# Patient Record
Sex: Male | Born: 1965 | Race: White | Hispanic: No | Marital: Married | Smoking: Current every day smoker
Health system: Southern US, Community
[De-identification: ages and names within clinical notes are randomized; demographics above are authoritative.]

---

## 2011-05-03 ENCOUNTER — Emergency Department: Payer: Self-pay | Admitting: Emergency Medicine

## 2013-10-26 ENCOUNTER — Emergency Department: Payer: Self-pay | Admitting: Emergency Medicine

## 2017-06-11 ENCOUNTER — Encounter: Payer: Self-pay | Admitting: Emergency Medicine

## 2017-06-11 ENCOUNTER — Emergency Department: Payer: Worker's Compensation

## 2017-06-11 ENCOUNTER — Emergency Department
Admission: EM | Admit: 2017-06-11 | Discharge: 2017-06-11 | Disposition: A | Discharge status: Home / Self Care | Payer: Worker's Compensation | Attending: Emergency Medicine | Admitting: Emergency Medicine

## 2017-06-11 DIAGNOSIS — Y999 Unspecified external cause status: Secondary | ICD-10-CM | POA: Diagnosis not present

## 2017-06-11 DIAGNOSIS — W231XXA Caught, crushed, jammed, or pinched between stationary objects, initial encounter: Secondary | ICD-10-CM | POA: Insufficient documentation

## 2017-06-11 DIAGNOSIS — S4991XA Unspecified injury of right shoulder and upper arm, initial encounter: Secondary | ICD-10-CM | POA: Diagnosis present

## 2017-06-11 DIAGNOSIS — M62838 Other muscle spasm: Secondary | ICD-10-CM | POA: Diagnosis not present

## 2017-06-11 DIAGNOSIS — Y929 Unspecified place or not applicable: Secondary | ICD-10-CM | POA: Insufficient documentation

## 2017-06-11 DIAGNOSIS — S471XXA Crushing injury of right shoulder and upper arm, initial encounter: Secondary | ICD-10-CM | POA: Diagnosis not present

## 2017-06-11 DIAGNOSIS — Y939 Activity, unspecified: Secondary | ICD-10-CM | POA: Diagnosis not present

## 2017-06-11 DIAGNOSIS — F17210 Nicotine dependence, cigarettes, uncomplicated: Secondary | ICD-10-CM | POA: Insufficient documentation

## 2017-06-11 DIAGNOSIS — R202 Paresthesia of skin: Secondary | ICD-10-CM

## 2017-06-11 MED ORDER — IBUPROFEN 800 MG PO TABS: 800.0000 mg | ORAL_TABLET | Freq: Three times a day (TID) | ORAL | 0 refills | Status: AC | PRN

## 2017-06-11 MED ORDER — ACETAMINOPHEN 500 MG PO TABS
1000.0000 mg | ORAL_TABLET | Freq: Once | ORAL | Status: AC
  Administered 2017-06-11: 1000 mg via ORAL
  Filled 2017-06-11: qty 2

## 2017-06-11 MED ORDER — OXYCODONE-ACETAMINOPHEN 5-325 MG PO TABS
2.0000 | ORAL_TABLET | Freq: Once | ORAL | Status: DC
  Filled 2017-06-11: qty 2

## 2017-06-11 MED ORDER — DIAZEPAM 5 MG PO TABS: 5.0000 mg | ORAL_TABLET | Freq: Three times a day (TID) | ORAL | 0 refills | Status: AC | PRN

## 2017-06-11 MED ORDER — DIAZEPAM 5 MG PO TABS
5.0000 mg | ORAL_TABLET | Freq: Once | ORAL | Status: AC
  Administered 2017-06-11: 5 mg via ORAL
  Filled 2017-06-11: qty 1

## 2017-06-11 NOTE — ED Triage Notes (Addendum)
,  Pt says this am, while working, he had a 17K lb trailer fall onto his right bicep; a co-worker had jacked the trailer up and as they were trying to fix something the trailer dropped off the jack and landed on his right upper arm;  tenderness on palpation to right upper arm; abrasion to area; pt says arm feels cold and is tingling; numbness from fingertips radiating up to the right side of his neck; strong radial pulse; denies any other injury; no visible swelling to arm at this time

## 2017-06-11 NOTE — ED Provider Notes (Addendum)
[facility] Emergency Department Provider Note       Time seen: ----------------------------------------- 7:07 AM on 06/11/2017 -----------------------------------------     I have reviewed the triage vital signs and the nursing notes.   HISTORY   Chief Complaint Arm Injury    HPI Tyrone Waters is a 51 y.o. male who presents to the ED for a right arm injury. Patient states while working this morning a heavy trailer fell onto his right upper arm. A coworker had jacked the trailer up as her trying to fix something on the trailer dropped off the jack and landed on his right upper arm. He has severe tenderness in his right upper arm and abrasion located laterally and over his antecubital area. Patient states her arm feels cold and tingly. He has numbness from the fingertips radiating up to the right side of his neck.   History reviewed. No pertinent past medical history.  There are no active problems to display for this patient.   History reviewed. No pertinent surgical history.  Allergies Patient has no known allergies.  Social History Social History  Substance Use Topics  . Smoking status: Current Every Day Smoker    Packs/day: 1.50    Types: Cigarettes  . Smokeless tobacco: Never Used  . Alcohol use No    Review of Systems Constitutional: Negative for fever. Cardiovascular: Negative for chest pain. Respiratory: Negative for shortness of breath. Gastrointestinal: Negative for abdominal pain, vomiting and diarrhea. Musculoskeletal: Positive for right arm pain Skin: Positive for abrasions Neurological: Positive for paresthesias  All systems negative/normal/unremarkable except as stated in the HPI  ____________________________________________   PHYSICAL EXAM:  VITAL SIGNS: ED Triage Vitals  Enc Vitals Group     BP 06/11/17 0626 (!) 150/90     Pulse Rate 06/11/17 0626 85     Resp 06/11/17 0626 17     Temp 06/11/17 0626 98.3 F (36.8  C)     Temp Source 06/11/17 0626 Oral     SpO2 --      Weight 06/11/17 0620 228 lb (103.4 kg)     Height 06/11/17 0620  (1.854 m)     Head Circumference --      Peak Flow --      Pain Score 06/11/17 0619 4     Pain Loc --      Pain Edu? --      Excl. in GC? --     Constitutional: Alert and oriented. Mild distress Eyes: Conjunctivae are normal. Normal extraocular movements. Cardiovascular: Normal rate, regular rhythm. No murmurs, rubs, or gallops. Respiratory: Normal respiratory effort without tachypnea nor retractions. Breath sounds are clear and equal bilaterally. No wheezes/rales/rhonchi. Musculoskeletal: Pain with range of motion of the right upper extremity with difficulty bending the elbow, severe tendernessParticularly over the right trapezius muscle. He describes paresthesias essentially down to his right thumb. Mild to moderate tenderness around the right upper arm diffusely. Compartments feel soft. Neurologic:  Normal speech and language. No gross focal neurologic deficits are appreciated.  Skin:  Abrasions appreciated over the right upper arm laterally as well as the right antecubital fossa Psychiatric: Mood and affect are normal. Speech and behavior are normal.  ____________________________________________  ED COURSE:  Pertinent labs & imaging results that were available during my care of the patient were reviewed by me and considered in my medical decision making (see chart for details). Patient presents for right upper arm crush injury, we will assess with imaging as indicated.  Procedures ____________________________________________   RADIOLOGY Images were viewed by me  Right humerus IMPRESSION: Probable nondisplaced fracture involving the junction of the middle and distal thirds of the shaft of the humerus with the fracture line extending inferiorly for distance of approximately 4.5 cm. IMPRESSION: 1. No fracture identified. The finding on radiography is  due to a nutrient vessel in the cortex. 2. Edema/ecchymosis laterally in the subcutaneous tissues of the mid upper arm. 3. Degenerative spurring in the elbow. ____________________________________________  FINAL ASSESSMENT AND PLAN  Crush injury, Ecchymosis   Plan: Patient's imaging was dictated above. Patient had presented for right arm injury. Initial x-rays reveal possible distal humerus fracture with resulting CT scan was negative. This does make a compartment syndrome less likely and his compartments are soft at this point. He does have C6 paresthesia in the right arm possibly from contusion but also possibly from spasm in the right side of his neck or stretch of his right brachial plexus. He does not have any motor deficit, strength, biceps, triceps and wrist extension appears to be normal. He'll be referred to orthopedics for close outpatient follow-up.   Emily FilbertWilliams, Jonathan E, MD   Note: This note was generated in part or whole with voice recognition software. Voice recognition is usually quite accurate but there are transcription errors that can and very often do occur. I apologize for any typographical errors that were not detected and corrected.     Emily FilbertWilliams, Jonathan E, MD 06/11/17 [id number]    Emily FilbertWilliams, Jonathan E, MD 06/11/17 [phone]

## 2019-01-10 IMAGING — CT CT HUMERUS*R* W/O CM
2 series · 10 of 28 positions shown, 13 images · non-contrast
Comparison: 06/11/2017

CLINICAL DATA: A heavy trailer fell on right upper arm. Possible
fracture on radiography

EXAM:
CT OF THE RIGHT HUMERUS WITHOUT CONTRAST
TECHNIQUE: Multidetector CT imaging was performed according to the standard
protocol. Multiplanar CT image reconstructions were also generated.

[Series 8: ax st · axial · 0.30mm/px · z∈[-553,-315]mm · 5 of 270 slices shown, 7 images]
[im 42/270  soft-tissue]
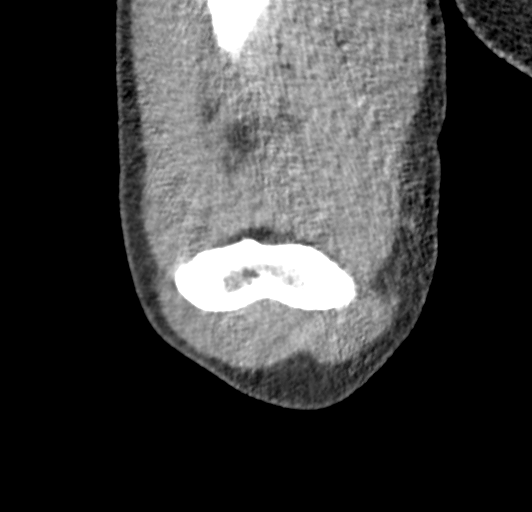
[im 42/270  bone]
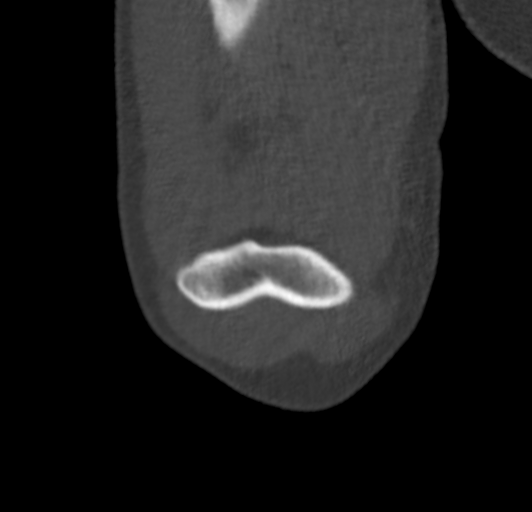
[im 83/270  bone]
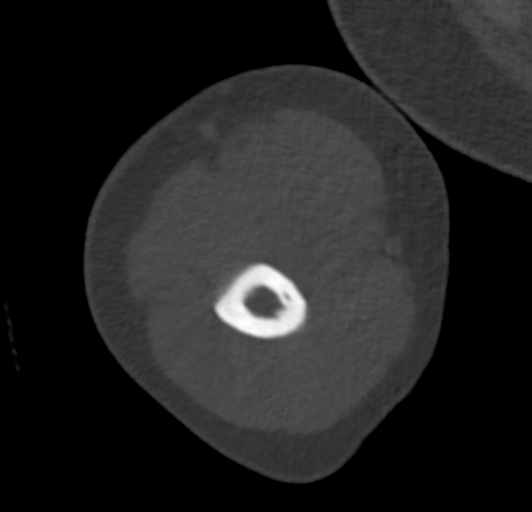
[im 145/270  bone]
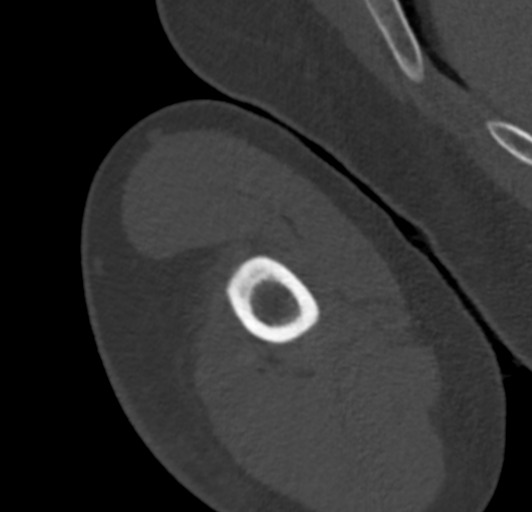
[im 187/270  bone]
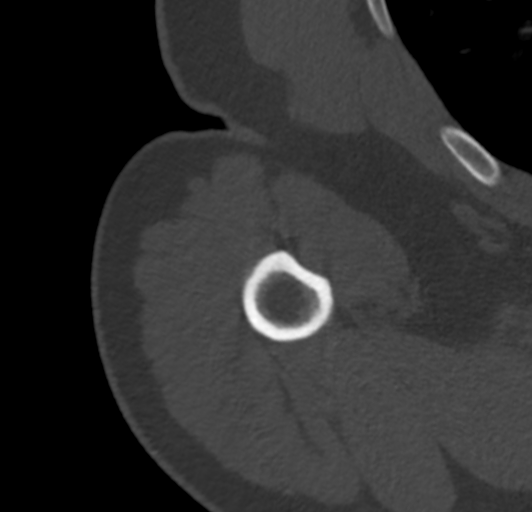
[im 228/270  soft-tissue]
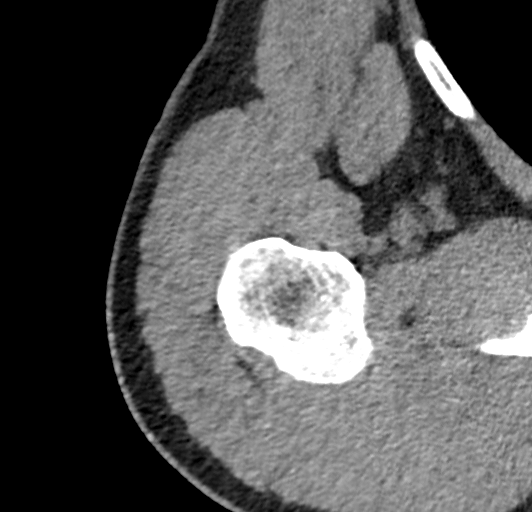
[im 228/270  bone]
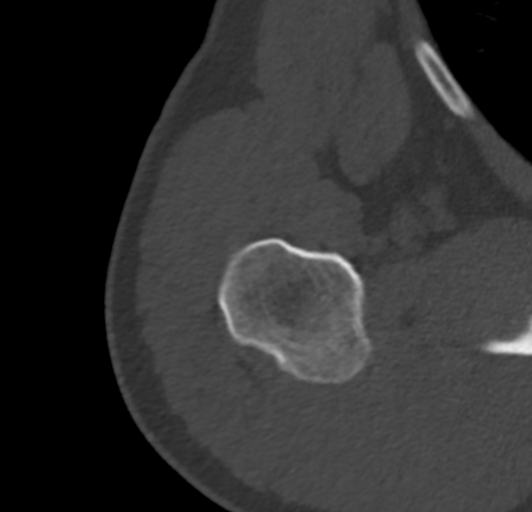

[Series 9: cor st · sagittal · 0.38mm/px · 5 of 65 slices shown, 6 images]
[im 22/65  bone]
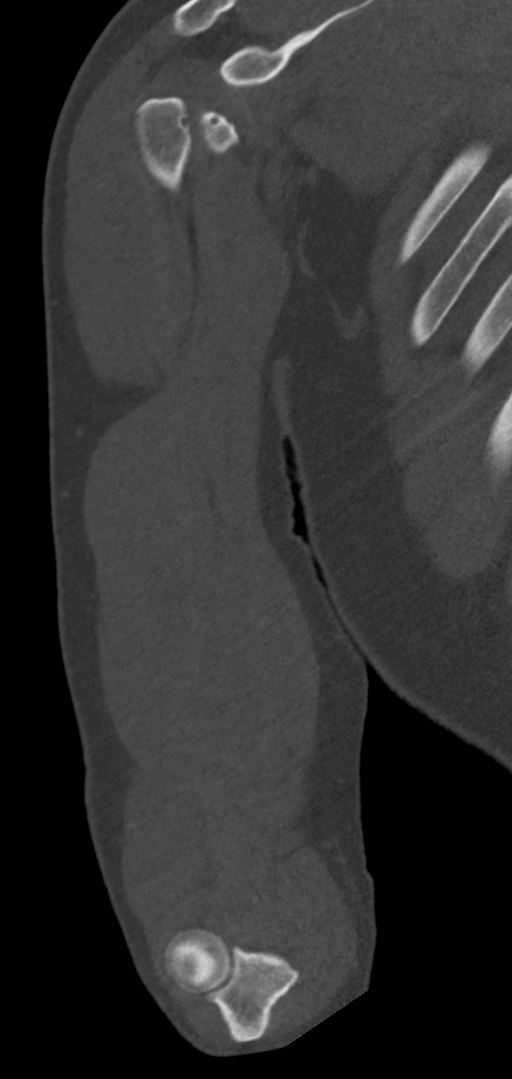
[im 27/65  bone]
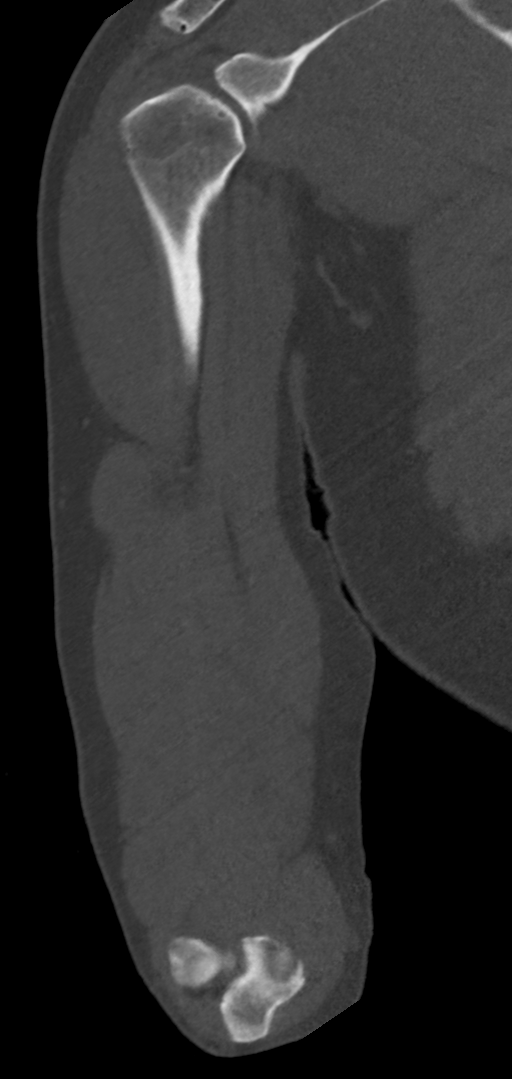
[im 33/65  soft-tissue]
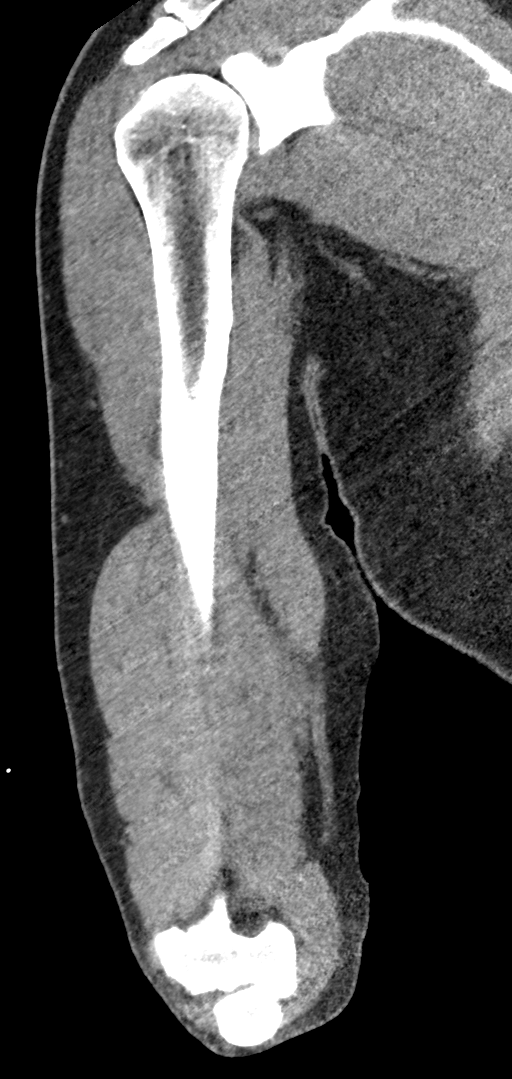
[im 33/65  bone]
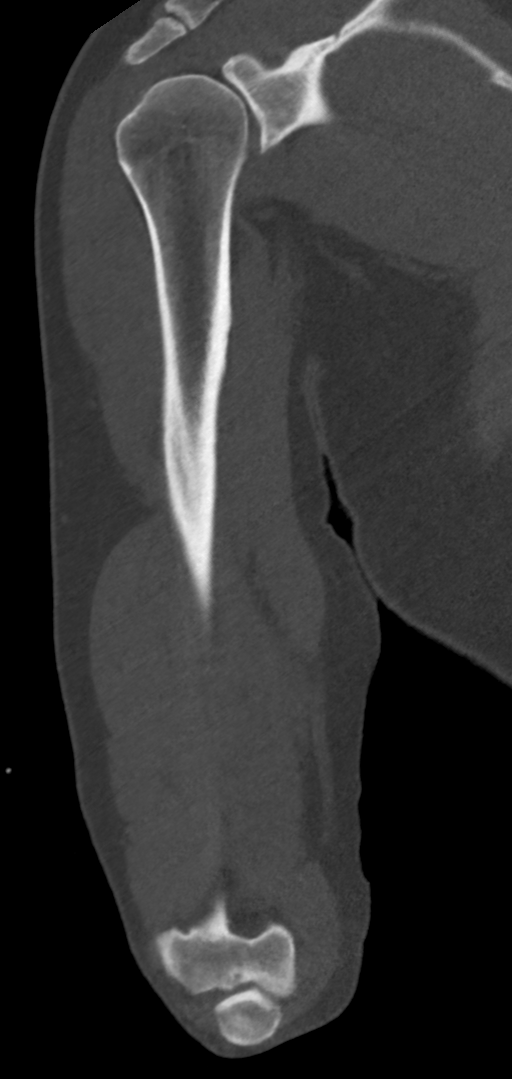
[im 38/65  bone]
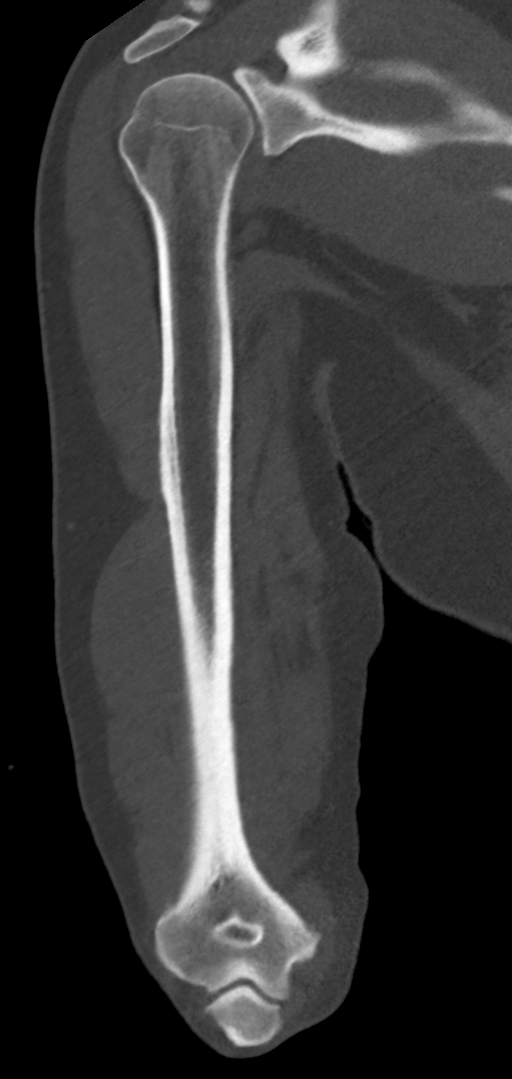
[im 43/65  bone]
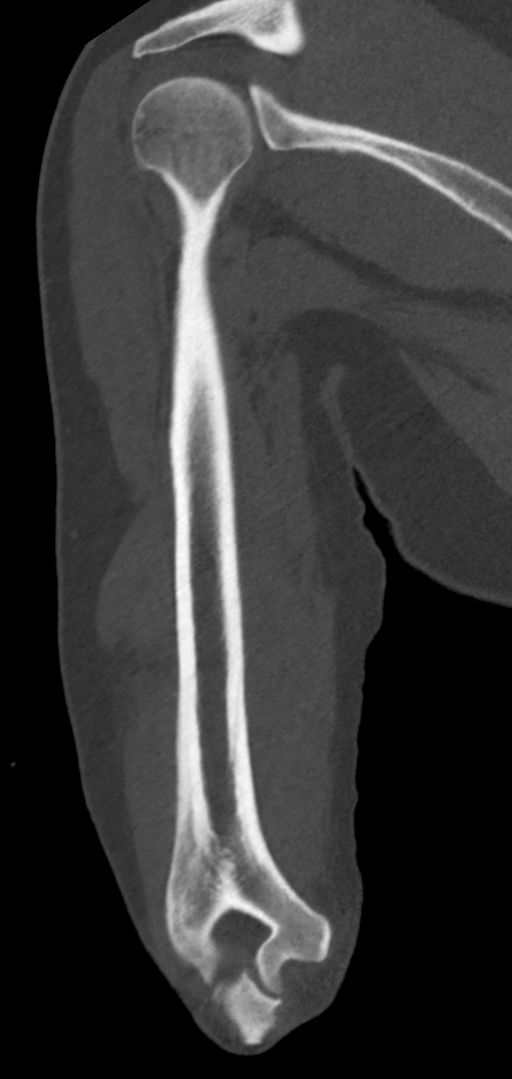

[10 of 28 positions shown; findings below may reference images not displayed]

FINDINGS: Bones/Joint/Cartilage

The finding on radiography is typical for a nutrient vessel in the
cortex of the distal humerus and is not felt to represent a
fracture. No associated periosteal reaction.

There is spurring in the elbow particularly along the olecranon and
coronoid process, and to a lesser extent along the radial head.

No glenohumeral malalignment.

Ligaments

Suboptimally assessed by CT.

Muscles and Tendons

Grossly unremarkable

Soft tissues

Abnormal subcutaneous edema laterally along the midportion of the
upper arm compatible with ecchymosis/bruising.
IMPRESSION: 1. No fracture identified. The finding on radiography is due to a
nutrient vessel in the cortex.
2. Edema/ecchymosis laterally in the subcutaneous tissues of the mid
upper arm.
3. Degenerative spurring in the elbow.

## 2019-11-10 ENCOUNTER — Ambulatory Visit: Payer: 59 | Attending: Internal Medicine

## 2019-11-10 DIAGNOSIS — Z20822 Contact with and (suspected) exposure to covid-19: Secondary | ICD-10-CM

## 2019-11-11 LAB — NOVEL CORONAVIRUS, NAA: SARS-CoV-2, NAA: DETECTED — AB

## 2023-09-14 ENCOUNTER — Other Ambulatory Visit: Payer: Self-pay | Admitting: Family Medicine

## 2023-09-14 DIAGNOSIS — E782 Mixed hyperlipidemia: Secondary | ICD-10-CM

## 2023-09-14 DIAGNOSIS — Z9189 Other specified personal risk factors, not elsewhere classified: Secondary | ICD-10-CM

## 2023-10-09 ENCOUNTER — Other Ambulatory Visit: Payer: 59

## 2024-01-01 ENCOUNTER — Other Ambulatory Visit: Payer: Self-pay | Admitting: Family Medicine

## 2024-01-01 DIAGNOSIS — Z9189 Other specified personal risk factors, not elsewhere classified: Secondary | ICD-10-CM

## 2024-05-21 ENCOUNTER — Other Ambulatory Visit: Payer: Self-pay | Admitting: Family Medicine

## 2024-05-21 DIAGNOSIS — Z9189 Other specified personal risk factors, not elsewhere classified: Secondary | ICD-10-CM

## 2024-05-21 DIAGNOSIS — E782 Mixed hyperlipidemia: Secondary | ICD-10-CM

## 2024-06-04 ENCOUNTER — Other Ambulatory Visit

## 2024-06-05 ENCOUNTER — Ambulatory Visit
Admission: RE | Admit: 2024-06-05 | Discharge: 2024-06-05 | Disposition: A | Discharge status: Home / Self Care | Payer: Self-pay | Source: Ambulatory Visit | Attending: Family Medicine | Admitting: Family Medicine

## 2024-06-05 DIAGNOSIS — E782 Mixed hyperlipidemia: Secondary | ICD-10-CM | POA: Insufficient documentation

## 2024-06-05 DIAGNOSIS — Z9189 Other specified personal risk factors, not elsewhere classified: Secondary | ICD-10-CM | POA: Insufficient documentation
